# Patient Record
Sex: Male | Born: 1965 | Race: White | Hispanic: No | Marital: Married | Smoking: Never smoker
Health system: Southern US, Community
[De-identification: ages and names within clinical notes are randomized; demographics above are authoritative.]

## PROBLEM LIST (undated history)

## (undated) DIAGNOSIS — G4733 Obstructive sleep apnea (adult) (pediatric): Secondary | ICD-10-CM

## (undated) DIAGNOSIS — I10 Essential (primary) hypertension: Secondary | ICD-10-CM

## (undated) DIAGNOSIS — T7840XA Allergy, unspecified, initial encounter: Secondary | ICD-10-CM

## (undated) DIAGNOSIS — Z9289 Personal history of other medical treatment: Secondary | ICD-10-CM

## (undated) DIAGNOSIS — Z1509 Genetic susceptibility to other malignant neoplasm: Secondary | ICD-10-CM

## (undated) DIAGNOSIS — D229 Melanocytic nevi, unspecified: Secondary | ICD-10-CM

## (undated) HISTORY — DX: Allergy, unspecified, initial encounter: T78.40XA

## (undated) HISTORY — PX: HERNIA REPAIR: SHX51

## (undated) HISTORY — DX: Melanocytic nevi, unspecified: D22.9

## (undated) HISTORY — DX: Genetic susceptibility to other malignant neoplasm: Z15.09

## (undated) HISTORY — DX: Personal history of other medical treatment: Z92.89

## (undated) HISTORY — DX: Essential (primary) hypertension: I10

## (undated) HISTORY — DX: Obstructive sleep apnea (adult) (pediatric): G47.33

---

## 1999-07-15 ENCOUNTER — Emergency Department (HOSPITAL_COMMUNITY): Admission: EM | Admit: 1999-07-15 | Discharge: 1999-07-15 | Payer: Self-pay | Admitting: Emergency Medicine

## 1999-07-15 ENCOUNTER — Encounter: Payer: Self-pay | Admitting: Emergency Medicine

## 1999-07-23 ENCOUNTER — Ambulatory Visit (HOSPITAL_COMMUNITY): Admission: RE | Admit: 1999-07-23 | Discharge: 1999-07-23 | Payer: Self-pay | Admitting: Family Medicine

## 2000-01-11 ENCOUNTER — Encounter: Admission: RE | Admit: 2000-01-11 | Discharge: 2000-01-11 | Payer: Self-pay | Admitting: Family Medicine

## 2000-02-04 ENCOUNTER — Ambulatory Visit (HOSPITAL_COMMUNITY): Admission: RE | Admit: 2000-02-04 | Discharge: 2000-02-04 | Payer: Self-pay | Admitting: Sports Medicine

## 2000-02-04 HISTORY — PX: SKIN BIOPSY: SHX1

## 2000-02-18 ENCOUNTER — Encounter: Admission: RE | Admit: 2000-02-18 | Discharge: 2000-02-18 | Payer: Self-pay | Admitting: Family Medicine

## 2000-02-25 ENCOUNTER — Encounter: Admission: RE | Admit: 2000-02-25 | Discharge: 2000-02-25 | Payer: Self-pay | Admitting: Family Medicine

## 2000-05-09 HISTORY — PX: ANTERIOR CRUCIATE LIGAMENT REPAIR: SHX115

## 2001-03-26 ENCOUNTER — Encounter: Admission: RE | Admit: 2001-03-26 | Discharge: 2001-03-26 | Payer: Self-pay | Admitting: Family Medicine

## 2001-04-30 ENCOUNTER — Encounter: Admission: RE | Admit: 2001-04-30 | Discharge: 2001-04-30 | Payer: Self-pay | Admitting: Family Medicine

## 2001-10-19 ENCOUNTER — Encounter: Admission: RE | Admit: 2001-10-19 | Discharge: 2001-10-19 | Payer: Self-pay | Admitting: Family Medicine

## 2004-03-22 ENCOUNTER — Ambulatory Visit: Payer: Self-pay | Admitting: Internal Medicine

## 2004-04-23 ENCOUNTER — Ambulatory Visit: Payer: Self-pay | Admitting: Internal Medicine

## 2004-05-15 ENCOUNTER — Ambulatory Visit (HOSPITAL_COMMUNITY): Admission: RE | Admit: 2004-05-15 | Discharge: 2004-05-15 | Payer: Self-pay | Admitting: Gastroenterology

## 2004-11-16 ENCOUNTER — Ambulatory Visit: Payer: Self-pay | Admitting: Internal Medicine

## 2004-12-05 ENCOUNTER — Ambulatory Visit: Payer: Self-pay | Admitting: Internal Medicine

## 2004-12-13 ENCOUNTER — Ambulatory Visit: Payer: Self-pay

## 2006-03-06 ENCOUNTER — Ambulatory Visit: Payer: Self-pay | Admitting: Internal Medicine

## 2006-03-06 LAB — CONVERTED CEMR LAB
ALT: 28 units/L (ref 0–40)
AST: 22 units/L (ref 0–37)
Albumin: 3.9 g/dL (ref 3.5–5.2)
Alkaline Phosphatase: 35 units/L — ABNORMAL LOW (ref 39–117)
BUN: 12 mg/dL (ref 6–23)
Basophils Absolute: 0 10*3/uL (ref 0.0–0.1)
Basophils Relative: 0.2 % (ref 0.0–1.0)
Bilirubin Urine: NEGATIVE
CO2: 28 meq/L (ref 19–32)
Calcium: 9.4 mg/dL (ref 8.4–10.5)
Chloride: 105 meq/L (ref 96–112)
Chol/HDL Ratio, serum: 4.7
Cholesterol: 177 mg/dL (ref 0–200)
Creatinine, Ser: 1 mg/dL (ref 0.4–1.5)
Eosinophil percent: 1.7 % (ref 0.0–5.0)
GFR calc non Af Amer: 88 mL/min
Glomerular Filtration Rate, Af Am: 106 mL/min/{1.73_m2}
Glucose, Bld: 99 mg/dL (ref 70–99)
HCT: 45.2 % (ref 39.0–52.0)
HDL: 37.4 mg/dL — ABNORMAL LOW (ref >39.0)
Hemoglobin, Urine: NEGATIVE
Hemoglobin: 16.2 g/dL (ref 13.0–17.0)
Ketones, ur: NEGATIVE mg/dL
LDL Cholesterol: 118 mg/dL — ABNORMAL HIGH (ref 0–99)
Leukocytes, UA: NEGATIVE
Lymphocytes Relative: 27.3 % (ref 12.0–46.0)
MCHC: 35.9 g/dL (ref 30.0–36.0)
MCV: 86.6 fL (ref 78.0–100.0)
Monocytes Absolute: 0.4 10*3/uL (ref 0.2–0.7)
Monocytes Relative: 9.7 % (ref 3.0–11.0)
Neutro Abs: 2.6 10*3/uL (ref 1.4–7.7)
Neutrophils Relative %: 61.1 % (ref 43.0–77.0)
Nitrite: NEGATIVE
PSA: 1.58 ng/mL (ref 0.10–4.00)
Platelets: 203 10*3/uL (ref 150–400)
Potassium: 4.1 meq/L (ref 3.5–5.1)
RBC: 5.22 M/uL (ref 4.22–5.81)
RDW: 11.9 % (ref 11.5–14.6)
Sodium: 139 meq/L (ref 135–145)
Specific Gravity, Urine: >=1.03 (ref 1.000–1.03)
TSH: 0.8 microintl units/mL (ref 0.35–5.50)
Total Bilirubin: 0.9 mg/dL (ref 0.3–1.2)
Total Protein, Urine: NEGATIVE mg/dL
Total Protein: 6.9 g/dL (ref 6.0–8.3)
Triglyceride fasting, serum: 108 mg/dL (ref 0–149)
Urine Glucose: NEGATIVE mg/dL
Urobilinogen, UA: 0.2 (ref 0.0–1.0)
VLDL: 22 mg/dL (ref 0–40)
WBC: 4.3 10*3/uL — ABNORMAL LOW (ref 4.5–10.5)
pH: 5.5 (ref 5.0–8.0)

## 2006-03-10 ENCOUNTER — Ambulatory Visit: Payer: Self-pay | Admitting: Internal Medicine

## 2007-01-23 ENCOUNTER — Encounter: Payer: Self-pay | Admitting: Internal Medicine

## 2007-04-29 ENCOUNTER — Encounter: Payer: Self-pay | Admitting: Internal Medicine

## 2007-04-29 DIAGNOSIS — J309 Allergic rhinitis, unspecified: Secondary | ICD-10-CM | POA: Insufficient documentation

## 2007-04-29 DIAGNOSIS — I10 Essential (primary) hypertension: Secondary | ICD-10-CM | POA: Insufficient documentation

## 2007-06-26 ENCOUNTER — Telehealth: Payer: Self-pay | Admitting: Internal Medicine

## 2007-12-10 ENCOUNTER — Telehealth: Payer: Self-pay | Admitting: Internal Medicine

## 2007-12-11 ENCOUNTER — Ambulatory Visit: Payer: Self-pay | Admitting: Internal Medicine

## 2007-12-11 DIAGNOSIS — E785 Hyperlipidemia, unspecified: Secondary | ICD-10-CM | POA: Insufficient documentation

## 2007-12-11 DIAGNOSIS — R079 Chest pain, unspecified: Secondary | ICD-10-CM | POA: Insufficient documentation

## 2007-12-12 ENCOUNTER — Encounter: Payer: Self-pay | Admitting: Internal Medicine

## 2007-12-16 ENCOUNTER — Telehealth: Payer: Self-pay | Admitting: Internal Medicine

## 2007-12-18 ENCOUNTER — Ambulatory Visit: Payer: Self-pay | Admitting: Psychology

## 2007-12-28 ENCOUNTER — Ambulatory Visit: Payer: Self-pay | Admitting: Psychology

## 2008-01-08 ENCOUNTER — Ambulatory Visit: Payer: Self-pay | Admitting: Psychology

## 2008-01-11 ENCOUNTER — Ambulatory Visit: Payer: Self-pay | Admitting: Psychology

## 2008-01-13 ENCOUNTER — Telehealth: Payer: Self-pay | Admitting: Internal Medicine

## 2008-01-28 ENCOUNTER — Telehealth: Payer: Self-pay | Admitting: Internal Medicine

## 2008-01-28 ENCOUNTER — Ambulatory Visit: Payer: Self-pay | Admitting: Internal Medicine

## 2008-01-28 LAB — CONVERTED CEMR LAB
Cholesterol: 170 mg/dL (ref 0–200)
HDL: 38.2 mg/dL — ABNORMAL LOW (ref >39.0)
LDL Cholesterol: 114 mg/dL — ABNORMAL HIGH (ref 0–99)
Total CHOL/HDL Ratio: 4.5
Triglycerides: 88 mg/dL (ref 0–149)
VLDL: 18 mg/dL (ref 0–40)

## 2008-02-02 ENCOUNTER — Telehealth: Payer: Self-pay | Admitting: Internal Medicine

## 2008-02-10 ENCOUNTER — Telehealth: Payer: Self-pay | Admitting: Internal Medicine

## 2008-02-12 ENCOUNTER — Ambulatory Visit: Payer: Self-pay | Admitting: Psychology

## 2008-03-07 ENCOUNTER — Ambulatory Visit: Payer: Self-pay | Admitting: Psychology

## 2008-03-18 ENCOUNTER — Encounter: Payer: Self-pay | Admitting: Internal Medicine

## 2008-05-23 ENCOUNTER — Ambulatory Visit: Payer: Self-pay | Admitting: Psychology

## 2008-06-14 ENCOUNTER — Encounter: Payer: Self-pay | Admitting: Internal Medicine

## 2008-06-22 ENCOUNTER — Ambulatory Visit: Payer: Self-pay | Admitting: Psychology

## 2008-07-10 ENCOUNTER — Encounter: Payer: Self-pay | Admitting: Internal Medicine

## 2008-07-10 ENCOUNTER — Ambulatory Visit (HOSPITAL_BASED_OUTPATIENT_CLINIC_OR_DEPARTMENT_OTHER): Admission: RE | Admit: 2008-07-10 | Discharge: 2008-07-10 | Payer: Self-pay | Admitting: Otolaryngology

## 2008-07-13 ENCOUNTER — Ambulatory Visit: Payer: Self-pay | Admitting: Internal Medicine

## 2008-08-26 ENCOUNTER — Ambulatory Visit: Payer: Self-pay | Admitting: Psychology

## 2008-09-01 ENCOUNTER — Encounter: Payer: Self-pay | Admitting: Internal Medicine

## 2008-10-05 ENCOUNTER — Ambulatory Visit: Payer: Self-pay | Admitting: Psychology

## 2008-10-17 ENCOUNTER — Ambulatory Visit: Payer: Self-pay | Admitting: Psychology

## 2008-10-31 ENCOUNTER — Telehealth: Payer: Self-pay | Admitting: Internal Medicine

## 2008-12-15 ENCOUNTER — Ambulatory Visit: Payer: Self-pay | Admitting: Psychology

## 2009-02-08 ENCOUNTER — Ambulatory Visit: Payer: Self-pay | Admitting: Psychology

## 2009-02-14 ENCOUNTER — Ambulatory Visit: Payer: Self-pay | Admitting: Psychology

## 2009-03-22 ENCOUNTER — Encounter: Payer: Self-pay | Admitting: Internal Medicine

## 2009-03-29 ENCOUNTER — Encounter: Admission: RE | Admit: 2009-03-29 | Discharge: 2009-03-29 | Payer: Self-pay | Admitting: Otolaryngology

## 2009-05-25 ENCOUNTER — Ambulatory Visit: Payer: Self-pay | Admitting: Psychology

## 2009-07-11 ENCOUNTER — Telehealth: Payer: Self-pay | Admitting: Internal Medicine

## 2009-07-19 ENCOUNTER — Telehealth: Payer: Self-pay | Admitting: Internal Medicine

## 2009-09-01 ENCOUNTER — Encounter: Payer: Self-pay | Admitting: Internal Medicine

## 2009-09-22 ENCOUNTER — Ambulatory Visit: Payer: Self-pay | Admitting: Internal Medicine

## 2009-09-22 DIAGNOSIS — J342 Deviated nasal septum: Secondary | ICD-10-CM | POA: Insufficient documentation

## 2009-09-22 DIAGNOSIS — G4733 Obstructive sleep apnea (adult) (pediatric): Secondary | ICD-10-CM | POA: Insufficient documentation

## 2009-09-22 LAB — CONVERTED CEMR LAB
ALT: 28 units/L (ref 0–53)
AST: 26 units/L (ref 0–37)
Albumin: 4.4 g/dL (ref 3.5–5.2)
Alkaline Phosphatase: 45 units/L (ref 39–117)
BUN: 17 mg/dL (ref 6–23)
Basophils Absolute: 0 10*3/uL (ref 0.0–0.1)
Basophils Relative: 0.6 % (ref 0.0–3.0)
Bilirubin, Direct: 0.2 mg/dL (ref 0.0–0.3)
CO2: 30 meq/L (ref 19–32)
Calcium: 9.5 mg/dL (ref 8.4–10.5)
Chloride: 106 meq/L (ref 96–112)
Cholesterol: 163 mg/dL (ref 0–200)
Creatinine, Ser: 1 mg/dL (ref 0.4–1.5)
Eosinophils Absolute: 0.1 10*3/uL (ref 0.0–0.7)
Eosinophils Relative: 1.3 % (ref 0.0–5.0)
GFR calc non Af Amer: 88.76 mL/min (ref >60)
Glucose, Bld: 87 mg/dL (ref 70–99)
HCT: 44.3 % (ref 39.0–52.0)
HDL: 39.4 mg/dL (ref >39.00)
Hemoglobin: 15.5 g/dL (ref 13.0–17.0)
LDL Cholesterol: 102 mg/dL — ABNORMAL HIGH (ref 0–99)
Lymphocytes Relative: 34.2 % (ref 12.0–46.0)
Lymphs Abs: 1.4 10*3/uL (ref 0.7–4.0)
MCHC: 35.1 g/dL (ref 30.0–36.0)
MCV: 88.6 fL (ref 78.0–100.0)
Monocytes Absolute: 0.5 10*3/uL (ref 0.1–1.0)
Monocytes Relative: 12 % (ref 3.0–12.0)
Neutro Abs: 2.1 10*3/uL (ref 1.4–7.7)
Neutrophils Relative %: 51.9 % (ref 43.0–77.0)
Platelets: 199 10*3/uL (ref 150.0–400.0)
Potassium: 4.2 meq/L (ref 3.5–5.1)
RBC: 5 M/uL (ref 4.22–5.81)
RDW: 12.9 % (ref 11.5–14.6)
Sodium: 139 meq/L (ref 135–145)
Total Bilirubin: 0.7 mg/dL (ref 0.3–1.2)
Total CHOL/HDL Ratio: 4
Total Protein: 7.5 g/dL (ref 6.0–8.3)
Triglycerides: 106 mg/dL (ref 0.0–149.0)
VLDL: 21.2 mg/dL (ref 0.0–40.0)
WBC: 4.1 10*3/uL — ABNORMAL LOW (ref 4.5–10.5)

## 2009-09-27 ENCOUNTER — Telehealth (INDEPENDENT_AMBULATORY_CARE_PROVIDER_SITE_OTHER): Payer: Self-pay | Admitting: *Deleted

## 2010-04-01 ENCOUNTER — Encounter: Payer: Self-pay | Admitting: Internal Medicine

## 2010-04-01 ENCOUNTER — Encounter: Payer: Self-pay | Admitting: Otolaryngology

## 2010-04-08 LAB — CONVERTED CEMR LAB
ALT: 26 units/L (ref 0–53)
AST: 22 units/L (ref 0–37)
Albumin: 4.2 g/dL (ref 3.5–5.2)
Alkaline Phosphatase: 40 units/L (ref 39–117)
BUN: 13 mg/dL (ref 6–23)
Basophils Absolute: 0 10*3/uL (ref 0.0–0.1)
Basophils Relative: 0.4 % (ref 0.0–3.0)
Bilirubin, Direct: 0.2 mg/dL (ref 0.0–0.3)
CO2: 28 meq/L (ref 19–32)
Calcium: 9.2 mg/dL (ref 8.4–10.5)
Chloride: 103 meq/L (ref 96–112)
Cholesterol: 195 mg/dL (ref 0–200)
Creatinine, Ser: 1 mg/dL (ref 0.4–1.5)
Eosinophils Absolute: 0 10*3/uL (ref 0.0–0.7)
Eosinophils Relative: 0.8 % (ref 0.0–5.0)
GFR calc Af Amer: 105 mL/min
GFR calc non Af Amer: 87 mL/min
Glucose, Bld: 98 mg/dL (ref 70–99)
HCT: 46.3 % (ref 39.0–52.0)
HDL: 38 mg/dL — ABNORMAL LOW (ref >39.0)
Hemoglobin: 16.1 g/dL (ref 13.0–17.0)
LDL Cholesterol: 136 mg/dL — ABNORMAL HIGH (ref 0–99)
Lymphocytes Relative: 33.4 % (ref 12.0–46.0)
MCHC: 34.9 g/dL (ref 30.0–36.0)
MCV: 89.8 fL (ref 78.0–100.0)
Monocytes Absolute: 0.4 10*3/uL (ref 0.1–1.0)
Monocytes Relative: 10 % (ref 3.0–12.0)
Neutro Abs: 2.2 10*3/uL (ref 1.4–7.7)
Neutrophils Relative %: 55.4 % (ref 43.0–77.0)
Platelets: 203 10*3/uL (ref 150–400)
Potassium: 3.9 meq/L (ref 3.5–5.1)
RBC: 5.15 M/uL (ref 4.22–5.81)
RDW: 11.6 % (ref 11.5–14.6)
Sodium: 138 meq/L (ref 135–145)
Total Bilirubin: 1 mg/dL (ref 0.3–1.2)
Total CHOL/HDL Ratio: 5.1
Total Protein: 7.4 g/dL (ref 6.0–8.3)
Triglycerides: 103 mg/dL (ref 0–149)
VLDL: 21 mg/dL (ref 0–40)
WBC: 3.9 10*3/uL — ABNORMAL LOW (ref 4.5–10.5)

## 2010-04-10 NOTE — Progress Notes (Signed)
  Phone Note Other Incoming   Request: Send information Summary of Call: Records received from  Dr. Raye Sorrow office. 13 pages forwarded to Dr. Debby Bud for review.

## 2010-04-10 NOTE — Progress Notes (Signed)
Summary: REFILL - HCTZ  Phone Note Refill Request   Refills Requested: Medication #1:  HYDROCHLOROTHIAZIDE 25 MG  TABS Take 1/2  tablet by mouth once a day Last office visit 12/2007. When is pt due for f/u? Ok to refill?   Initial call taken by: Lamar Sprinkles, CMA,  Jul 11, 2009 8:44 AM  Follow-up for Phone Call        OK to refill x 2 months - will need EOV, labs - metabolic 401.9, lipid 272.4, hepatic 995.20 Follow-up by: Jacques Navy MD,  Jul 11, 2009 12:00 PM    Prescriptions: HYDROCHLOROTHIAZIDE 25 MG  TABS (HYDROCHLOROTHIAZIDE) Take 1/2  tablet by mouth once a day  #30 x 0   Entered by:   Lamar Sprinkles, CMA   Authorized by:   Jacques Navy MD   Signed by:   Lamar Sprinkles, CMA on 07/11/2009   Method used:   [facility]  [address] [phone]* (retail[facility]       [address]       [place], [place]  [address]       Ph: [phone]       Fax: [phone]   RxID:   [id number]

## 2010-04-10 NOTE — Progress Notes (Signed)
Summary: [facility]  [facility]   Imported By: Lester [place] 10/03/2009 08:55:30  _____________________________________________________________________  External Attachment:    Type:   Image     Comment:   External Document

## 2010-04-10 NOTE — Assessment & Plan Note (Signed)
Summary: PER FLAG/SD--2 MTH EXTENDED OV--STC   Vital Signs:  Patient profile:   45 year old male Height:      69 inches Weight:      239 pounds BMI:     35.42 O2 Sat:      97 % on Room air Temp:     97.4 degrees F oral Pulse rate:   59 / minute BP sitting:   126 / 88  (left arm) Cuff size:   large  Vitals Entered By: Bill Salinas CMA (September 22, 2009 10:17 AM)  O2 Flow:  Room air CC: CPX/ ab   Primary Care Provider:  Jacques Navy MD  CC:  CPX/ ab.  History of Present Illness: Patient presents for routine medical evaluation. He has had no success with weight loss.   Interval has seen Dr. Lazarus Salines - he had sleep study which was positive and he was tried on CPAP. couldn't tolerate any of the devices. He is also diagnosed with severe right deviated septum and large turbinates. However, the patient doesn't have enough of a problem to feel he wants to proceed with surgery.   Interval - fracture 3rd digit right hand. Saw Dr. Merlyn Lot (son) - did not require surgery. Has some residual stiffness.  Preventive Screening-Counseling & Management  Alcohol-Tobacco     Alcohol drinks/day: 0     Smoking Status: never  Current Medications (verified): 1)  Lisinopril 20 Mg  Tabs (Lisinopril) .... Take 1/2 Tablet By Mouth Once A Day 2)  Aspirin 325 Mg  Tabs (Aspirin) .... Take 1/2 Tablet By Mouth Once A Day 3)  Multivitamins   Tabs (Multiple Vitamin) .... Take 1 Tablet By Mouth Once A Day 4)  Hydrochlorothiazide 25 Mg  Tabs (Hydrochlorothiazide) .... Take 1/2  Tablet By Mouth Once A Day 5)  Ibuprofen 200 Mg  Caps (Ibuprofen) .... As Needed 6)  Lovastatin 40 Mg Tabs (Lovastatin) .... 1/2 Once Daily 7)  Omeprazole 40 Mg Cpdr (Omeprazole) .Marland Kitchen.. 1 To 2 Daily As Needed  Allergies (verified): No Known Drug Allergies  Past History:  Past Medical History: Familial multiple mole and melanoma syndrome Hypertension Allergic rhinitis OSA Deviated septum - right   Physician Roster:   ENT - Dr. Lazarus Salines              Derm - Dr. Jorja Loa              Psych - Dr. Dellia Cloud  Past Surgical History: ACL repair - 05/09/2000,  ETT - 15 minutes - good tolerance, elev. BP - 02/04/2000,  skin lesion bx 11/01 - 02/04/2000 Inguinal herniorrhaphy (right at age 76)  Review of Systems  The patient denies anorexia, weight loss, weight gain, decreased hearing, hoarseness, chest pain, syncope, dyspnea on exertion, peripheral edema, headaches, hematochezia, severe indigestion/heartburn, muscle weakness, suspicious skin lesions, difficulty walking, unusual weight change, enlarged lymph nodes, angioedema, and testicular masses.    Physical Exam  General:  Well-developed,well-nourished,in no acute distress; alert,appropriate and cooperative throughout examination Head:  Normocephalic and atraumatic without obvious abnormalities. No apparent alopecia or balding. Eyes:  No corneal or conjunctival inflammation noted. EOMI. Perrla. Funduscopic exam benign, without hemorrhages, exudates or papilledema. Vision grossly normal. Ears:  External ear exam shows no significant lesions or deformities.  Otoscopic examination reveals clear canals, tympanic membranes are intact bilaterally without bulging, retraction, inflammation or discharge. Hearing is grossly normal bilaterally. Nose:  no external deformity, no external erythema, no mucosal edema, and no airflow obstruction.   Mouth:  Oral mucosa and oropharynx without lesions or exudates.  Teeth in good repair. Neck:  supple, full ROM, no thyromegaly, and no carotid bruits.   Chest Wall:  No deformities, masses, tenderness or gynecomastia noted. Lungs:  Normal respiratory effort, chest expands symmetrically. Lungs are clear to auscultation, no crackles or wheezes. Heart:  Normal rate and regular rhythm. S1 and S2 normal without gallop, murmur, click, rub or other extra sounds. Abdomen:  soft, non-tender, normal bowel sounds, no guarding, no rigidity, and no  hepatomegaly.   Rectal:  no external abnormalities, no hemorrhoids, and normal sphincter tone.   Prostate:  Prostate gland firm and smooth, no enlargement, nodularity, tenderness, mass, asymmetry or induration. Msk:  normal ROM, no joint tenderness, no joint swelling, no redness over joints, and no joint deformities.   Pulses:  2+ radial Extremities:  No clubbing, cyanosis, edema, or deformity noted with normal full range of motion of all joints.   Neurologic:  alert & oriented X3, cranial nerves II-XII intact, strength normal in all extremities, gait normal, and DTRs symmetrical and normal.   Skin:  turgor normal, color normal, no suspicious lesions, and no ulcerations.   Cervical Nodes:  no anterior cervical adenopathy and no posterior cervical adenopathy.   Axillary Nodes:  no R axillary adenopathy and no L axillary adenopathy.   Inguinal Nodes:  no R inguinal adenopathy and no L inguinal adenopathy.   Psych:  Oriented X3, memory intact for recent and remote, normally interactive, and good eye contact.     Impression & Recommendations:  Problem # 1:  SLEEP APNEA, OBSTRUCTIVE (ICD-327.23) Study not available to me.....have requested results. At this time he has no hypersomnolence. He has not tolerated any of the CPAP devices.  Recommendations will be based on review of study.   Problem # 2:  DEVIATED SEPTUM (ICD-470) He denies any airflow obstruction or symptoms.  Plan - watchful waiting.  Problem # 3:  HYPERLIPIDEMIA (ICD-272.4)  His updated medication list for this problem includes:    Lovastatin 40 Mg Tabs (Lovastatin) .Marland Kitchen... 1/2 once daily  Orders: TLB-Lipid Panel (80061-LIPID) TLB-Hepatic/Liver Function Pnl (80076-HEPATIC)  Excellent control on lovastatin. Plan is to continue the same.  Problem # 4:  HYPERTENSION (ICD-401.9)  His updated medication list for this problem includes:    Lisinopril 20 Mg Tabs (Lisinopril) .Marland Kitchen... Take 1/2 tablet by mouth once a day     Hydrochlorothiazide 25 Mg Tabs (Hydrochlorothiazide) .Marland Kitchen... Take 1/2  tablet by mouth once a day  Orders: TLB-BMP (Basic Metabolic Panel-BMET) (80048-METABOL)  BP today: 126/88 Prior BP: 132/88 (12/11/2007)  Good control on present medications.  Problem # 5:  Preventive Health Care (ICD-V70.0) History as noted. Normal physical exam. Lab resuslts are within normal limits. He is current with immunizations.  In summary - a very nice man who appears medically stable at this time. Will review Sleep study when available and get back to him on recommendations.  He will return as needed or in 1 year.   Complete Medication List: 1)  Lisinopril 20 Mg Tabs (Lisinopril) .... Take 1/2 tablet by mouth once a day 2)  Aspirin 325 Mg Tabs (Aspirin) .... Take 1/2 tablet by mouth once a day 3)  Multivitamins Tabs (Multiple vitamin) .... Take 1 tablet by mouth once a day 4)  Hydrochlorothiazide 25 Mg Tabs (Hydrochlorothiazide) .... Take 1/2  tablet by mouth once a day 5)  Ibuprofen 200 Mg Caps (Ibuprofen) .... As needed 6)  Lovastatin 40 Mg Tabs (Lovastatin) .... 1/2  once daily 7)  Omeprazole 40 Mg Cpdr (Omeprazole) .Marland Kitchen.. 1 to 2 daily as needed  Other Orders: TLB-CBC Platelet - w/Differential (85025-CBCD)  Patient: Jordan Travis Note: All result statuses are Final unless otherwise noted.  Tests: (1) Lipid Panel (LIPID)   Cholesterol               163 mg/dL                   3-244     ATP III Classification            Desirable:  < 200 mg/dL                    Borderline High:  200 - 239 mg/dL               High:  > = 240 mg/dL   Triglycerides             106.0 mg/dL                 0.1-027.2     Normal:  <150 mg/dL     Borderline High:  536 - 199 mg/dL   HDL                       64.40 mg/dL                 >34.74   VLDL Cholesterol          21.2 mg/dL                  2.5-95.6   LDL Cholesterol      [H]  387 mg/dL                   5-64  CHO/HDL Ratio:  CHD Risk                             4                     Men          Women     1/2 Average Risk     3.4          3.3     Average Risk          5.0          4.4     2X Average Risk          9.6          7.1     3X Average Risk          15.0          11.0                           Tests: (2) Hepatic/Liver Function Panel (HEPATIC)   Total Bilirubin           0.7 mg/dL                   3.3-2.9   Direct Bilirubin          0.2 mg/dL                   5.1-8.8   Alkaline Phosphatase      45 U/L  39-117   AST                       26 U/L                      0-37   ALT                       28 U/L                      0-53   Total Protein             7.5 g/dL                    1.6-1.0   Albumin                   4.4 g/dL                    9.6-0.4  Tests: (3) BMP (METABOL)   Sodium                    139 mEq/L                   135-145   Potassium                 4.2 mEq/L                   3.5-5.1   Chloride                  106 mEq/L                   96-112   Carbon Dioxide            30 mEq/L                    19-32   Glucose                   87 mg/dL                    54-09   BUN                       17 mg/dL                    8-11   Creatinine                1.0 mg/dL                   9.1-4.7   Calcium                   9.5 mg/dL                   8.2-95.6   GFR                       88.76 mL/min                >60  Tests: (4) CBC Platelet w/Diff (CBCD)   White Cell Count     [L]  4.1 K/uL                    4.5-10.5  Red Cell Count            5.00 Mil/uL                 4.22-5.81   Hemoglobin                15.5 g/dL                   91.4-78.2   Hematocrit                44.3 %                      39.0-52.0   MCV                       88.6 fl                     78.0-100.0   MCHC                      35.1 g/dL                   95.6-21.3   RDW                       12.9 %                      11.5-14.6   Platelet Count            199.0 K/uL                  150.0-400.0   Neutrophil %               51.9 %                      43.0-77.0   Lymphocyte %              34.2 %                      12.0-46.0   Monocyte %                12.0 %                      3.0-12.0   Eosinophils%              1.3 %                       0.0-5.0   Basophils %               0.6 %                       0.0-3.0   Neutrophill Absolute      2.1 K/uL                    1.4-7.7   Lymphocyte Absolute       1.4 K/uL                    0.7-4.0   Monocyte Absolute         0.5 K/uL                    0.1-1.0  Eosinophils,  Absolute                             0.1 K/uL                    0.0-0.7   Basophils Absolute        0.0 K/uL                    0.0-0.1Prescriptions: OMEPRAZOLE 40 MG CPDR (OMEPRAZOLE) 1 to 2 daily as needed  #90 x 3   Entered and Authorized by:   Jacques Navy MD   Signed by:   Jacques Navy MD on 09/22/2009   Method used:   Electronically to        [facility] [phone]* (retail)       [address]       [place], [place]  [address]       Ph: [phone]       Fax: [phone]   RxID:   [id number] LOVASTATIN 40 MG TABS (LOVASTATIN) 1/2 once daily  #90 x 1   Entered and Authorized by:   Jacques Navy MD   Signed by:   Jacques Navy MD on 09/22/2009   Method used:   Electronically to        [facility] [phone]* (retail)       [address]       [place], [place]  [address]       Ph: [phone]       Fax: [phone]   RxID:   [id number]

## 2010-04-10 NOTE — Letter (Signed)
Summary: [facility]  [facility]   Imported By: Lester [place] 10/03/2009 08:57:45  _____________________________________________________________________  External Attachment:    Type:   Image     Comment:   External Document

## 2010-04-10 NOTE — Progress Notes (Signed)
  Phone Note Call from Patient Call back at Work Phone [phone]   Caller: Patient Summary of Call: Per pt, request refill on HCTZ thru [facility] on 07/11/2009 and would like to check status. Per EMR[facility] I have  sent rx to corrected pharmacy and notified pt/ lmovm Initial call taken by: Rock Nephew CMA,  Jul 19, 2009 10:10 AM    Prescriptions: HYDROCHLOROTHIAZIDE 25 MG  TABS (HYDROCHLOROTHIAZIDE) Take 1/2  tablet by mouth once a day  #30 x 2   Entered by:   Rock Nephew CMA   Authorized by:   Jacques Navy MD   Signed by:   Rock Nephew CMA on 07/19/2009   Method used:   Electronically to        [facility] [phone]* (retail)       [address]       [place], [place]  [address]       Ph: [phone]       Fax: [phone]   RxID:   [id number]

## 2010-04-10 NOTE — Letter (Signed)
Summary: [facility]   Imported By: Sherian Rein 03/27/2009 13:33:34  _____________________________________________________________________  External Attachment:    Type:   Image     Comment:   External Document

## 2010-05-31 ENCOUNTER — Ambulatory Visit: Payer: Self-pay | Admitting: Psychology

## 2010-07-24 NOTE — Procedures (Signed)
NAME:  Jordan Travis, CIFELLI NO.:  [id number]   MEDICAL RECORD NO.:  [id number]          PATIENT TYPE:  OUT   LOCATION:  SLEEP CENTER                 FACILITY:  [facility]   PHYSICIAN:  Clinton D. Maple Hudson, MD, FCCP, FACPDATE OF BIRTH:  12-05-64   DATE OF STUDY:  07/10/2008                            NOCTURNAL POLYSOMNOGRAM   REFERRING PHYSICIAN:  Zola Button T. Lazarus Salines, M.D.   INDICATION FOR STUDY:  Hypersomnia with sleep apnea.  Epworth sleepiness  score 11/24, BMI 32, weight 235 pounds, height 72 inches, and neck 18  inches.  Home medications are charted and reviewed.   SLEEP ARCHITECTURE:  Total sleep time 290 minutes with sleep efficiency  71.9%.  Stage I was 19%, stage II 61%, stage III 5.3%, REM 14.7% of  total sleep time.  Sleep latency 48 minutes, REM latency 232 minutes,  awake after sleep onset 67 minutes, arousal index 29.6.  No bedtime  medication was reported.   RESPIRATORY DATA:  Apnea/hypopnea index (AHI) 5.4 per hour.  A total of  26 events was scored all as hypopneas, nonpositional.  REM AHI 5.6 per  hour.  An additional 56 respiratory events related to arousal were  counted as events not meeting duration and oxygen desaturation criteria  to be scored as apneas or hypopneas, but recognized by the technician as  associated with EEG arousal, for a total respiratory disturbance index  of 17 per hour.  There were insufficient early events to permit CPAP  titration by split protocol on the study night.   OXYGEN DATA:  Moderate snoring with oxygen desaturation to a nadir of  85%.  Mean oxygen saturation through the study was 92.5% on room air.   CARDIAC DATA:  Normal sinus rhythm.   MOVEMENT/PARASOMNIA:  No significant movement disturbance.  No bathroom  trips.   IMPRESSION/RECOMMENDATIONS:  1. Minimal obstructive sleep apnea/hypopnea syndrome, AHI 5.4 per      hour.  By the broader measure of respiratory disturbance index      (RDI) of 17 per hour, he would be  scored as mild-to-moderate      obstructive sleep apnea/hypopnea syndrome.  Events were      nonpositional, somewhat more common while supine.  Moderate snoring      with oxygen desaturation to a nadir of 85%.  2. There were insufficient early events to permit continuous positive      airway pressure titration by split protocol on      this study night.  3. On arrival, the patient stated was that sometimes he would wake up      with acid reflux and could not breathe.      Clinton D. Maple Hudson, MD, [facility], FACP  Diplomate, Biomedical engineer of Sleep Medicine  Electronically Signed     CDY/MEDQ  D:  07/15/2008 20:49:10  T:  07/16/2008 06:46:09  Job:  [id number]

## 2010-07-27 NOTE — Assessment & Plan Note (Signed)
[facility]                           PRIMARY CARE OFFICE NOTE   LINKOLN, ALKIRE                     MRN:          [id number]  DATE:03/10/2006                            DOB:          [birth year]    PRIMARY CARE OFFICE NOTE:  Jordan Travis is a 45 year old gentleman who  presents today for routine followup evaluation and exam.  He was last  seen on December 05, 2004.  Please see that dictation.   The patient reports that he has been under some stress lately due to a  social change with his wife who is a Armed forces operational officer having been laid  off after 11 years of work.  At this point the plan is for her to do  some undergraduate at [employer] and then apply to P.A. school.  The patient  reports his brother has had a successful heart transplant, although he  had a difficult course.  His sister had a significant illness after a  lap band procedure with evidently a perforated esophagus with a 56-month  illness involving long term critical care and ventilatory support.  All  of this is combined to cause the patient to have significant stress.  However, he is sleeping well and reports that he is managing and does  not need any intervention or treatment at this time.   PAST MEDICAL HISTORY:   SURGICAL:  1. Repair of right inguinal hernia at age 47.  2. Open surgery of the right knee for repair of an ACL.   MEDICAL:  1. No childhood diseases but fully immunized.  2. Seasonal allergies and hay-fever.  3. Hypertension.   FAMILY HISTORY:  Significant for coronary artery disease with his father  having died at a very young age, having had a first MI at 34.  Mother  had an MI at age 64.  Brother with significant heart disease, now status  post transplant.  Family history also positive for hypertension and CVA.  Family history is negative for colon cancer, prostate cancer, or  diabetes.   SOCIAL HISTORY:  As above.  The patient continues to work full time on  the [employer].  He has been doing off duty security  work as well.  His children are now twin sons age 69 and daughter age 82.   REVIEW OF SYSTEMS:  Negative for any constitutional problems.  He has  had an eye exam in the last 24 months.  He continues to have trouble  with halitosis.  He has had a thorough evaluation for this including  esophagram to rule out Zenker diverticulum.  The patient has had no  cardiovascular, respiratory complaints.  He has occasional sharp  paraesophageal discomfort most likely related to dyspepsia.  GU,  musculoskeletal, dermatologic or neurologic complaints.   PHYSICAL EXAMINATION:  VITAL SIGNS:  Temperature was 99.1, blood  pressure 135/85, pulse 63, weight 240.  GENERAL APPEARANCE:  This is an athletic gentleman, mildly overweight,  in no acute distress.  HEENT:  Normocephalic and atraumatic.  EACs and TMs were normal.  Oropharynx with native dentition in good repair.  No buccal or palate  lesions were noted.  Posterior pharynx was clear.  Conjunctivae and  sclerae were clear.  PERRLA.  EOMI.  Funduscopic exam was unremarkable.  NECK:  Supple without thyromegaly.  NODES:  No adenopathy was noted in the cervical, supraclavicular  regions.  CHEST:  No CVA tenderness.  LUNGS:  Clear to auscultation and percussion.  CARDIOVASCULAR:  With 2+ radial pulses, JVD or carotid bruits.  He had a  quiet precordium with a regular rate and rhythm without murmurs, rubs,  or gallops.  ABDOMEN:  Soft.  No guarding or rebound.  No organomegaly or  splenomegaly noted.  GENITALIA:  Normal male phallus.  Bilaterally descended testicles  without masses.  RECTAL:  Revealed normal sphincter tone.  No masses in the rectal vault.  Prostate was smooth, normal in size and contour without nodule.  The  patient had no evidence of inguinal hernia on examination.  EXTREMITIES:  Without clubbing, cyanosis, or edema.  No deformity was  noted.  NEUROLOGIC:   Nonfocal.  SKIN:  Clear.   LABORATORY:  Hemoglobin 16.2 grams, white count was 4,300 with a normal  differential.  Cholesterol is 177, triglycerides 108, HDL was 37.4, LDL  was 118.  Chemistries were normal with a blood sugar of 99.  Electrolytes were normal.  Kidney function normal with a creatinine of 1  and a GFR of 88 ml/min.  Thyroid function normal with a TSH of 0.80.  PSA was normal at 1.58.  Urinalysis was negative.   ASSESSMENT/PLAN:  1. Hypertension.  The patient's blood pressure is adequately      controlled on his present medical regimen and he will continue the      same which includes Lisinopril 20 mg daily, HCTZ 12.5 mg daily.  2. Cardiovascular risk management.  The patient's LDL cholesterol is      at goal for a gentleman his age with his risk profile.  However, he      has a very strong positive family history.  I did discuss with him      the reasonable option of starting Statin medication in order to      drive his LDL down into the sub-100 range and actually into the 70      to 80 range for maximum risk reduction.  I have directed the      patient to medlineplus.gov for more information about cholesterol      management and Statin drugs.  We did price out medications looking      at generic Simvastatin, Lovastatin, and Pravastatin all of which      would put him in the $30/month range cash pay and these are all      second tier drugs.  The patient will get back with me in regards to      his wishes in this regard.   In summary, the patient is medically stable at this time with a normal  examination as noted.  Blood pressure is well controlled with no other  abnormalities found on exam.  He will be back in touch with me in  regards to his wishes and in regards to risk reduction.     Rosalyn Gess Norins, MD  Electronically Signed    MEN/MedQ  DD: 03/11/2006  DT: 03/11/2006  Job #: [id number]   cc:   Berneda Rose, Mr.

## 2010-08-27 ENCOUNTER — Other Ambulatory Visit: Payer: Self-pay | Admitting: *Deleted

## 2010-08-28 ENCOUNTER — Other Ambulatory Visit: Payer: Self-pay | Admitting: *Deleted

## 2010-08-28 MED ORDER — HYDROCHLOROTHIAZIDE 25 MG PO TABS: 25.0000 mg | ORAL_TABLET | Freq: Every day | ORAL | Status: DC

## 2010-09-23 ENCOUNTER — Other Ambulatory Visit: Payer: Self-pay | Admitting: Internal Medicine

## 2010-10-02 ENCOUNTER — Other Ambulatory Visit: Payer: Self-pay | Admitting: Internal Medicine

## 2010-11-19 ENCOUNTER — Telehealth: Payer: Self-pay | Admitting: *Deleted

## 2010-11-19 DIAGNOSIS — Z0000 Encounter for general adult medical examination without abnormal findings: Secondary | ICD-10-CM

## 2010-11-19 NOTE — Telephone Encounter (Signed)
CPX LABS 

## 2010-12-14 ENCOUNTER — Encounter: Payer: Self-pay | Admitting: Internal Medicine

## 2011-01-22 ENCOUNTER — Encounter: Payer: Self-pay | Admitting: Internal Medicine

## 2011-01-29 ENCOUNTER — Encounter: Payer: Self-pay | Admitting: Internal Medicine

## 2011-04-10 ENCOUNTER — Other Ambulatory Visit (INDEPENDENT_AMBULATORY_CARE_PROVIDER_SITE_OTHER): Payer: 59

## 2011-04-10 ENCOUNTER — Ambulatory Visit (INDEPENDENT_AMBULATORY_CARE_PROVIDER_SITE_OTHER): Payer: 59 | Admitting: Internal Medicine

## 2011-04-10 ENCOUNTER — Encounter: Payer: Self-pay | Admitting: Internal Medicine

## 2011-04-10 DIAGNOSIS — Z0000 Encounter for general adult medical examination without abnormal findings: Secondary | ICD-10-CM

## 2011-04-10 DIAGNOSIS — E785 Hyperlipidemia, unspecified: Secondary | ICD-10-CM

## 2011-04-10 DIAGNOSIS — F419 Anxiety disorder, unspecified: Secondary | ICD-10-CM

## 2011-04-10 DIAGNOSIS — F411 Generalized anxiety disorder: Secondary | ICD-10-CM

## 2011-04-10 DIAGNOSIS — I10 Essential (primary) hypertension: Secondary | ICD-10-CM

## 2011-04-10 DIAGNOSIS — G4733 Obstructive sleep apnea (adult) (pediatric): Secondary | ICD-10-CM

## 2011-04-10 LAB — LIPID PANEL
HDL: 40.3 mg/dL (ref >39.00)
LDL Cholesterol: 96 mg/dL (ref 0–99)
Total CHOL/HDL Ratio: 4
VLDL: 33.6 mg/dL (ref 0.0–40.0)

## 2011-04-10 LAB — COMPREHENSIVE METABOLIC PANEL
ALT: 35 U/L (ref 0–53)
AST: 28 U/L (ref 0–37)
Albumin: 4.2 g/dL (ref 3.5–5.2)
Alkaline Phosphatase: 45 U/L (ref 39–117)
Calcium: 9.3 mg/dL (ref 8.4–10.5)
Chloride: 104 mEq/L (ref 96–112)
Potassium: 3.8 mEq/L (ref 3.5–5.1)
Sodium: 140 mEq/L (ref 135–145)
Total Protein: 7.4 g/dL (ref 6.0–8.3)

## 2011-04-10 LAB — HEPATIC FUNCTION PANEL
ALT: 35 U/L (ref 0–53)
Bilirubin, Direct: 0 mg/dL (ref 0.0–0.3)
Total Bilirubin: 0.4 mg/dL (ref 0.3–1.2)

## 2011-04-10 MED ORDER — ALPRAZOLAM 0.25 MG PO TABS: ORAL_TABLET | ORAL | Status: DC

## 2011-04-14 DIAGNOSIS — Z0000 Encounter for general adult medical examination without abnormal findings: Secondary | ICD-10-CM | POA: Insufficient documentation

## 2011-04-14 DIAGNOSIS — F419 Anxiety disorder, unspecified: Secondary | ICD-10-CM | POA: Insufficient documentation

## 2011-04-14 NOTE — Assessment & Plan Note (Signed)
Reviewed reason for "statin" therapy - he has a very positive family history of CAD and although his cholesterol levels were in normal range for additional risk reduction in light of his family hx "statins" were started. He has good numbers on lipid panel.  Plan - continue cardiac risk reduction.

## 2011-04-14 NOTE — Assessment & Plan Note (Signed)
Occasional irritability due to anxiety. Does carry this home with him.  Plan - xanax 0.25 mg prn           He is advised to inform medical officer at [facility] - xanax will show up on a drug screen as "benzo" use.

## 2011-04-14 NOTE — Assessment & Plan Note (Signed)
Unable to tolerate CPAP mask due to deviated septum. He denies any significant problem with daytime somnolence or any other symptoms related to sleep apnea.

## 2011-04-14 NOTE — Assessment & Plan Note (Signed)
BP Readings from Last 3 Encounters:  04/10/11 118/76  09/22/09 126/88  12/11/07 132/88   Very good control

## 2011-04-14 NOTE — Progress Notes (Signed)
Subjective:    Patient ID: Jordan Travis, male    DOB: [birth year], 46 y.o.   MRN: [id number]  HPI .Mr. Patriarca presents for general medical follow up. He has been doing well since his last visit. He has reduced stress at work. He is working out on a regular basis. He has had no major illness, injury or surgery. He has 3 teenagers and this is a source of some stress. He does at times have increased anxiety and irritability.  Past Medical History  Diagnosis Date  . Hypertension   . Allergy   . OSA (obstructive sleep apnea)   . 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 History of ETT     good tolerance, elevated BP 02-04-2000   Past Surgical History  Procedure Date  . Hernia repair At age 65    Inguinal  . Anterior cruciate ligament repair 03.01.2002  . Skin biopsy 11.26.2001   Family History  Problem Relation Age of Onset  . Heart disease Mother     CAD/MI  . Heart disease Father     CAD/MI, endocarditis  . Heart disease Brother     CAD/MI; cardiomyopathy - s/p heart transplant and doing well  . Diabetes Neg Hx   . Cancer Brother 41    Prostate   History   Social History  . Marital Status: Married    Spouse Name: N/A    Number of Children: N/A  . Years of Education: N/A   Occupational History  . Not on file.   Social History Main Topics  . Smoking status: Never Smoker   . Smokeless tobacco: Not on file  . Alcohol Use: Not on file  . Drug Use: Not on file  . Sexually Active: Not on file   Other Topics Concern  . Not on file   Social History Narrative   Sadie Haber, married 1993, twin sons 1999 1 daughter 91. 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        Review of Systems Constitutional:  Negative for fever, chills, activity change and unexpected weight change.  HEENT:  Negative for hearing loss, ear pain, congestion, neck stiffness and postnasal drip. Negative for sore throat or swallowing problems. Negative  for dental complaints.   Eyes: Negative for vision loss or change in visual acuity.  Respiratory: Negative for chest tightness and wheezing. Negative for DOE.   Cardiovascular: Negative for chest pain or palpitations. No decreased exercise tolerance Gastrointestinal: No change in bowel habit. No bloating or gas. No reflux or indigestion Genitourinary: Negative for urgency, frequency, flank pain and difficulty urinating.  Musculoskeletal: Negative for myalgias, back pain, arthralgias and gait problem.  Neurological: Negative for dizziness, tremors, weakness and headaches.  Hematological: Negative for adenopathy.  Psychiatric/Behavioral: Negative for behavioral problems and dysphoric mood.       Objective:   Physical Exam Filed Vitals:   04/10/11 1312  BP: 118/76  Pulse: 72  Temp: 97.5 F (36.4 C)  Resp: 16   Gen'l: Well nourished well developed white male in no acute distress  HEENT: Head: Normocephalic and atraumatic. Right Ear: External ear normal. EAC/TM nl. Left Ear: External ear normal.  EAC/TM nl. Nose: Nose normal. Mouth/Throat: Oropharynx is clear and moist. Dentition - native, in good repair. No buccal or palatal lesions. Posterior pharynx clear. Eyes: Conjunctivae and sclera clear. EOM intact. Pupils are equal, round, and reactive to light. Right eye exhibits no discharge. Left eye exhibits no discharge. Neck:  Normal range of motion. Neck supple. No JVD present. No tracheal deviation present. No thyromegaly present.  Cardiovascular: Normal rate, regular rhythm, no gallop, no friction rub, no murmur heard.      Quiet precordium. 2+ radial and DP pulses . No carotid bruits Pulmonary/Chest: Effort normal. No respiratory distress or increased WOB, no wheezes, no rales. No chest wall deformity or CVAT. Abdominal: Soft. Bowel sounds are normal in all quadrants. He exhibits no distension, no tenderness, no rebound or guarding, No heptosplenomegaly  Genitourinary:   deferred Musculoskeletal: Normal range of motion. He exhibits no edema and no tenderness.       Small and large joints without redness, synovial thickening or deformity. Full range of motion preserved about all small, median and large joints.  Lymphadenopathy:    He has no cervical or supraclavicular adenopathy.  Neurological: He is alert and oriented to person, place, and time. CN II-XII intact. DTRs 2+ and symmetrical biceps, radial and patellar tendons. Cerebellar function normal with no tremor, rigidity, normal gait and station.  Skin: Skin is warm and dry. No rash noted. No erythema.  Psychiatric: He has a normal mood and affect. His behavior is normal. Thought content normal.   Lab Results  Component Value Date   WBC 4.1* 09/22/2009   HGB 15.5 09/22/2009   HCT 44.3 09/22/2009   PLT 199.0 09/22/2009   GLUCOSE 96 04/10/2011   CHOL 170 04/10/2011   TRIG 168.0* 04/10/2011   HDL 40.30 04/10/2011   LDLCALC 96 04/10/2011   ALT 35 04/10/2011   ALT 35 04/10/2011   AST 28 04/10/2011   AST 28 04/10/2011   NA 140 04/10/2011   K 3.8 04/10/2011   CL 104 04/10/2011   CREATININE 1.1 04/10/2011   BUN 17 04/10/2011   CO2 28 04/10/2011   TSH 0.80 03/06/2006   PSA 1.58 03/06/2006           Assessment & Plan:

## 2011-04-14 NOTE — Assessment & Plan Note (Signed)
Interval medical history is unremarkable. Physical exam is normal. Lab results are in normal range. He is due for Tdap. He does have annual work related exams and has been normal.   In summary - a very nice man who is medically stable. He will return as needed or in 1 year.

## 2011-04-23 ENCOUNTER — Other Ambulatory Visit: Payer: Self-pay | Admitting: *Deleted

## 2011-04-23 MED ORDER — LOVASTATIN 20 MG PO TABS: 20.0000 mg | ORAL_TABLET | Freq: Every day | ORAL | Status: DC

## 2011-04-23 NOTE — Telephone Encounter (Signed)
Done

## 2011-05-28 IMAGING — RF DG ESOPHAGUS
9 series · 19 of 23 positions shown · non-contrast
Comparison: 05/15/2004

CLINICAL DATA: Evaluate reflux.  Question diverticulum.

ESOPHOGRAM/BARIUM SWALLOW
TECHNIQUE: Combined double contrast and single contrast
examination performed using effervescent crystals, thick barium
liquid, and thin barium liquid.
Fluoroscopy time:  3.1 minutes.

[Series 1: run · 2 of 3 slices shown (1 of 9)]
[im 1/3]
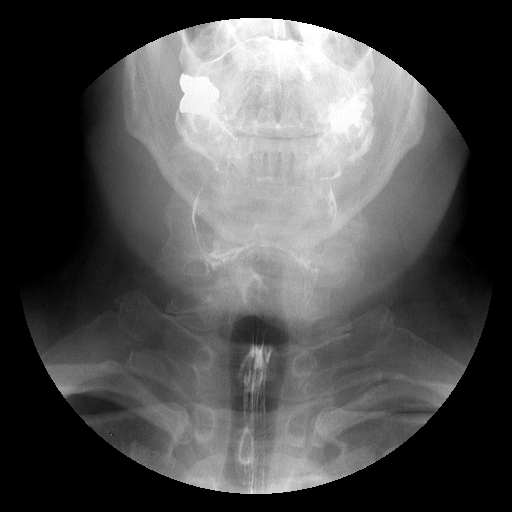
[im 2/3]
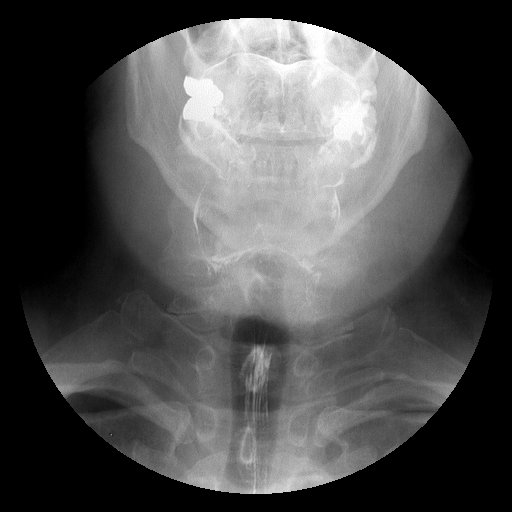

[Series 2: run · 6 of 7 slices shown (2 of 9)]
[im 1/7]
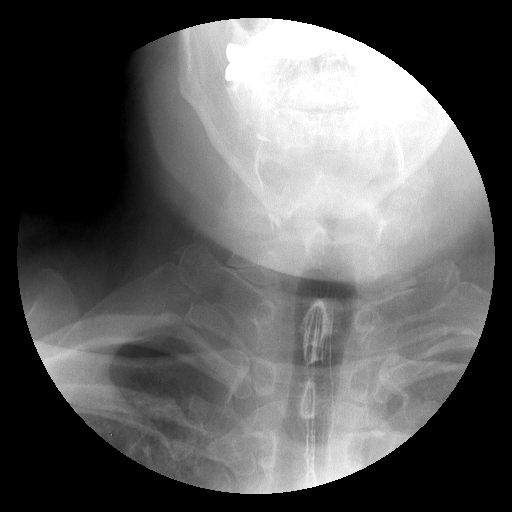
[im 2/7]
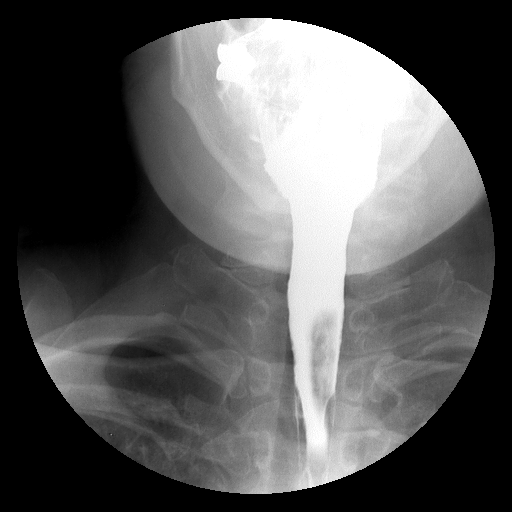
[im 3/7]
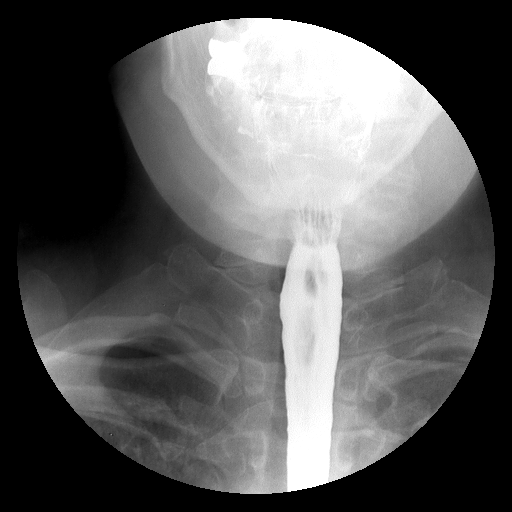
[im 4/7]
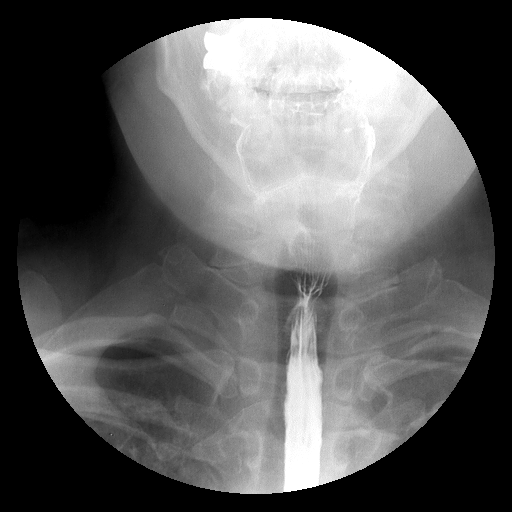
[im 5/7]
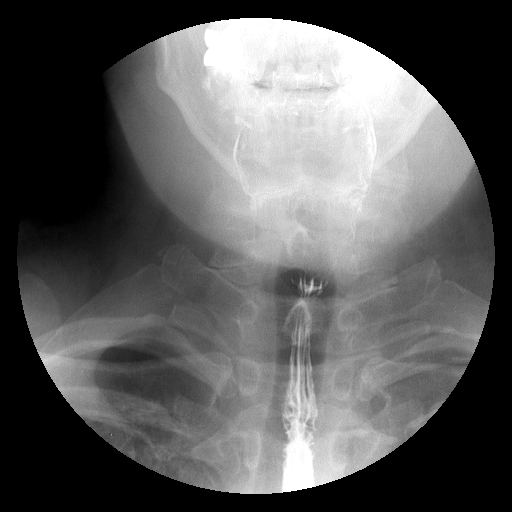
[im 7/7]
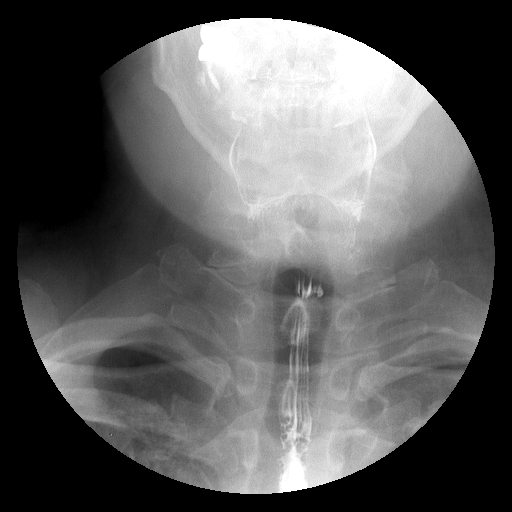

[Series 3: run · 2 of 2 slices shown (3 of 9)]
[im 1/2]
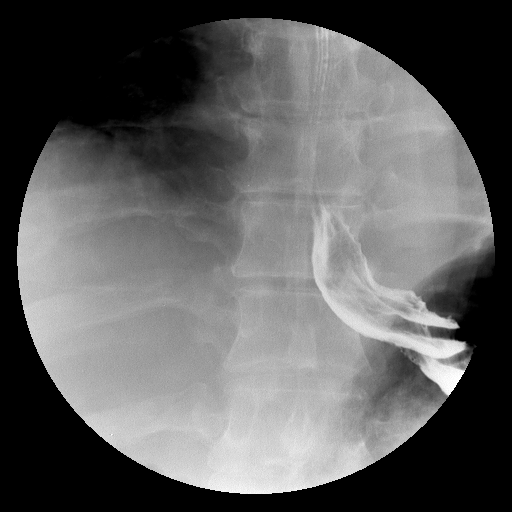
[im 2/2]
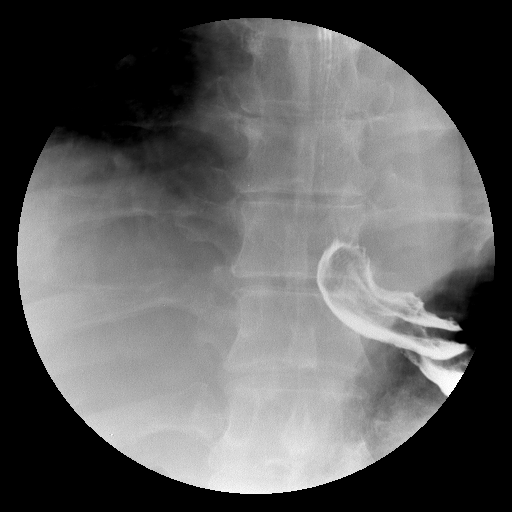

[Series 4: run · 2 of 2 slices shown (4 of 9)]
[im 1/2]
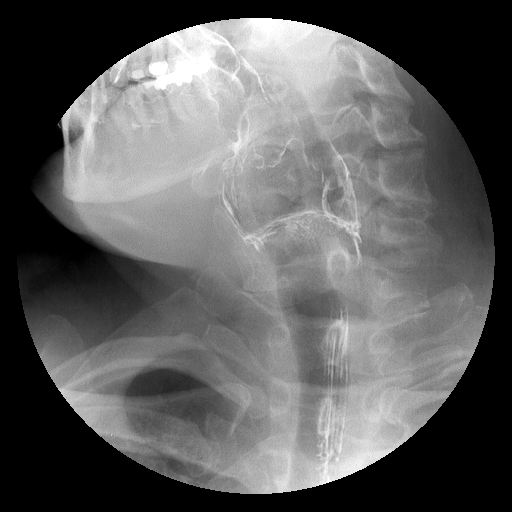
[im 2/2]
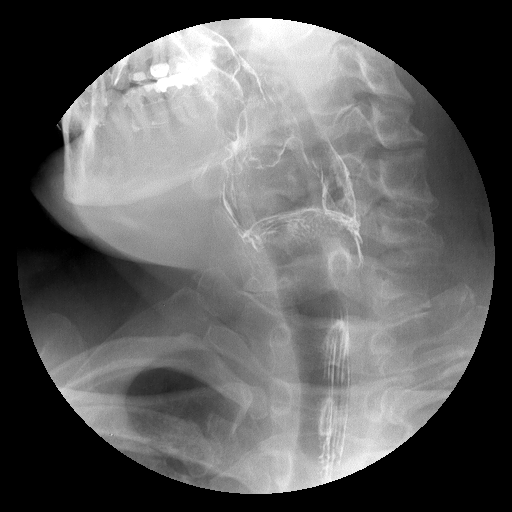

[Series 5: run · 1 of 2 slices shown (5 of 9)]
[im 2/2]
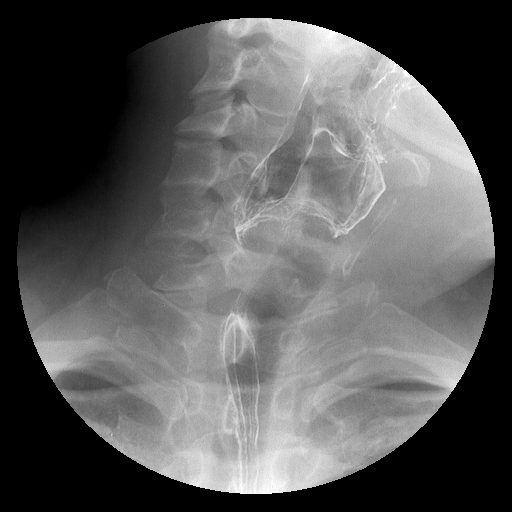

[Series 6: run · 2 of 2 slices shown (6 of 9)]
[im 1/2]
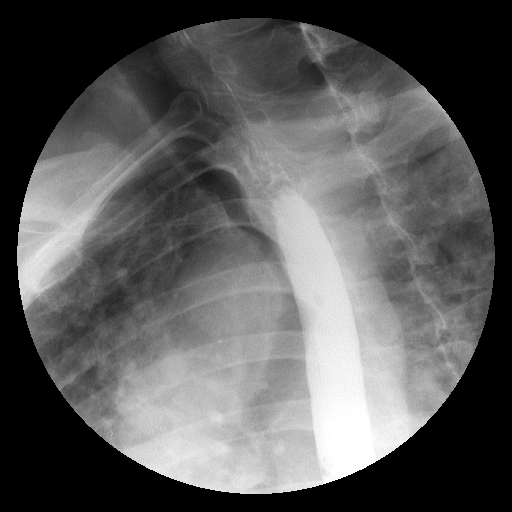
[im 2/2]
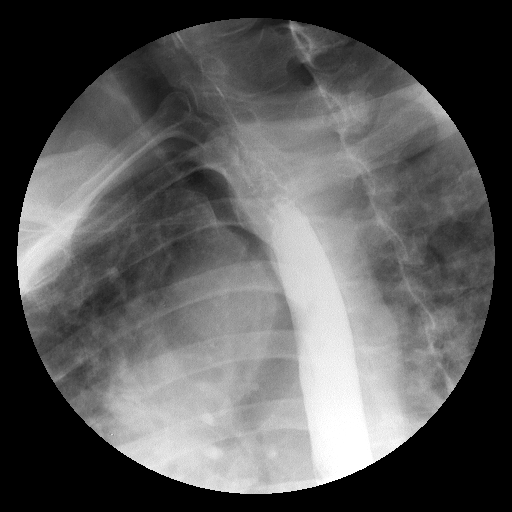

[Series 7: run · 2 of 2 slices shown (7 of 9)]
[im 1/2]
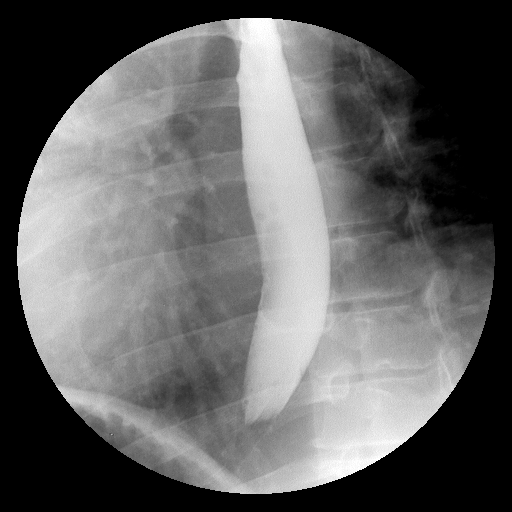
[im 2/2]
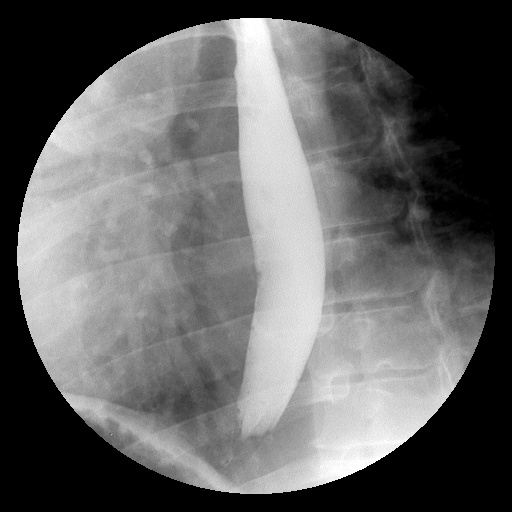

[Series 8: run · 1 of 2 slices shown (8 of 9)]
[im 2/2]
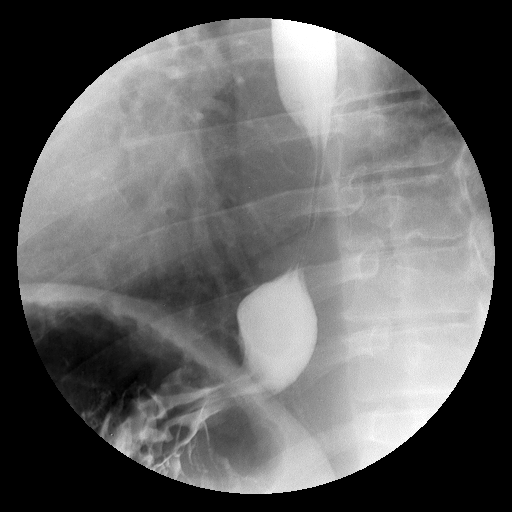

[Series 9: run · 1 of 1 slices shown (9 of 9)]
[im 1/1]
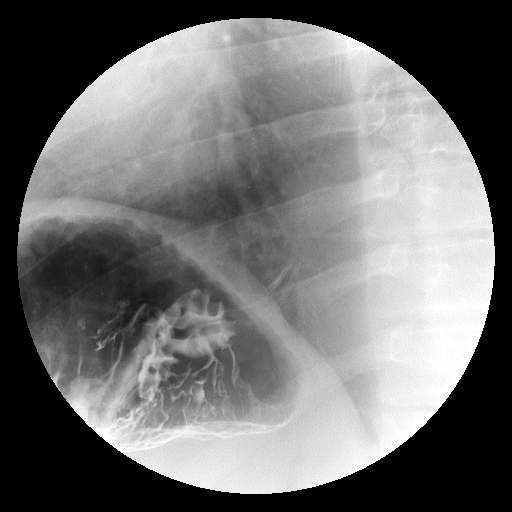

[19 of 23 positions shown; findings below may reference images not displayed]

FINDINGS: Normal swallowing function.  Valleculae and piriform
sinuses are symmetrical.  No Hermann Peter diverticulum.  No stricture.
The patient swallowed a 13 mm barium tablet in the erect position.
Tablet traversed the esophagus and entered the stomach without
delay.  Normal esophageal motility.  No hiatal hernia.  Moderate GE
reflux was noted.
IMPRESSION: GE reflux, otherwise negative study.

## 2011-08-06 ENCOUNTER — Other Ambulatory Visit: Payer: Self-pay | Admitting: Internal Medicine

## 2011-08-08 ENCOUNTER — Ambulatory Visit (INDEPENDENT_AMBULATORY_CARE_PROVIDER_SITE_OTHER): Payer: 59 | Admitting: Psychology

## 2011-08-08 DIAGNOSIS — F4321 Adjustment disorder with depressed mood: Secondary | ICD-10-CM

## 2011-08-16 ENCOUNTER — Other Ambulatory Visit: Payer: Self-pay | Admitting: *Deleted

## 2011-08-16 MED ORDER — LOVASTATIN 20 MG PO TABS: 20.0000 mg | ORAL_TABLET | Freq: Every day | ORAL | Status: DC

## 2011-08-16 NOTE — Telephone Encounter (Signed)
Pharmacy reqeust to change lovastatin 20mg  to 90 day supply and 3 refills

## 2011-09-10 ENCOUNTER — Other Ambulatory Visit: Payer: Self-pay | Admitting: *Deleted

## 2011-09-10 MED ORDER — LOVASTATIN 20 MG PO TABS: 20.0000 mg | ORAL_TABLET | Freq: Every day | ORAL | Status: DC

## 2011-11-12 ENCOUNTER — Other Ambulatory Visit: Payer: Self-pay | Admitting: Internal Medicine

## 2012-02-10 ENCOUNTER — Other Ambulatory Visit: Payer: Self-pay | Admitting: Internal Medicine

## 2012-03-25 ENCOUNTER — Other Ambulatory Visit: Payer: Self-pay | Admitting: Dermatology

## 2012-04-02 ENCOUNTER — Ambulatory Visit (INDEPENDENT_AMBULATORY_CARE_PROVIDER_SITE_OTHER): Payer: 59 | Admitting: Psychology

## 2012-04-02 DIAGNOSIS — F4321 Adjustment disorder with depressed mood: Secondary | ICD-10-CM

## 2012-04-13 ENCOUNTER — Ambulatory Visit (INDEPENDENT_AMBULATORY_CARE_PROVIDER_SITE_OTHER): Payer: 59 | Admitting: Internal Medicine

## 2012-04-13 ENCOUNTER — Other Ambulatory Visit (INDEPENDENT_AMBULATORY_CARE_PROVIDER_SITE_OTHER): Payer: 59

## 2012-04-13 ENCOUNTER — Encounter: Payer: Self-pay | Admitting: Internal Medicine

## 2012-04-13 VITALS — BP 136/88 | HR 63 | Temp 97.6°F | Resp 10 | Ht 70.5 in | Wt 246.1 lb

## 2012-04-13 DIAGNOSIS — G4733 Obstructive sleep apnea (adult) (pediatric): Secondary | ICD-10-CM

## 2012-04-13 DIAGNOSIS — Z0000 Encounter for general adult medical examination without abnormal findings: Secondary | ICD-10-CM

## 2012-04-13 DIAGNOSIS — E785 Hyperlipidemia, unspecified: Secondary | ICD-10-CM

## 2012-04-13 DIAGNOSIS — Z125 Encounter for screening for malignant neoplasm of prostate: Secondary | ICD-10-CM

## 2012-04-13 DIAGNOSIS — Z23 Encounter for immunization: Secondary | ICD-10-CM

## 2012-04-13 DIAGNOSIS — N529 Male erectile dysfunction, unspecified: Secondary | ICD-10-CM

## 2012-04-13 DIAGNOSIS — I10 Essential (primary) hypertension: Secondary | ICD-10-CM

## 2012-04-13 DIAGNOSIS — B070 Plantar wart: Secondary | ICD-10-CM

## 2012-04-13 DIAGNOSIS — F419 Anxiety disorder, unspecified: Secondary | ICD-10-CM

## 2012-04-13 DIAGNOSIS — F411 Generalized anxiety disorder: Secondary | ICD-10-CM

## 2012-04-13 LAB — COMPREHENSIVE METABOLIC PANEL
ALT: 33 U/L (ref 0–53)
AST: 30 U/L (ref 0–37)
Albumin: 4.1 g/dL (ref 3.5–5.2)
Calcium: 9.1 mg/dL (ref 8.4–10.5)
Chloride: 104 mEq/L (ref 96–112)
Potassium: 4.2 mEq/L (ref 3.5–5.1)

## 2012-04-13 LAB — PSA: PSA: 2.27 ng/mL (ref 0.10–4.00)

## 2012-04-13 LAB — HEPATIC FUNCTION PANEL
Albumin: 4.1 g/dL (ref 3.5–5.2)
Total Protein: 7 g/dL (ref 6.0–8.3)

## 2012-04-13 LAB — LIPID PANEL
Cholesterol: 164 mg/dL (ref 0–200)
LDL Cholesterol: 109 mg/dL — ABNORMAL HIGH (ref 0–99)
Total CHOL/HDL Ratio: 4

## 2012-04-13 MED ORDER — ALPRAZOLAM 0.25 MG PO TABS: ORAL_TABLET | ORAL | Status: DC

## 2012-04-13 MED ORDER — LISINOPRIL 20 MG PO TABS: 20.0000 mg | ORAL_TABLET | Freq: Every day | ORAL | Status: DC

## 2012-04-13 MED ORDER — ALPRAZOLAM 0.5 MG PO TABS: ORAL_TABLET | ORAL | Status: DC

## 2012-04-13 MED ORDER — OMEPRAZOLE 40 MG PO CPDR: 40.0000 mg | DELAYED_RELEASE_CAPSULE | Freq: Every day | ORAL | Status: DC

## 2012-04-13 MED ORDER — HYDROCHLOROTHIAZIDE 25 MG PO TABS: 25.0000 mg | ORAL_TABLET | Freq: Every day | ORAL | Status: DC

## 2012-04-13 NOTE — Patient Instructions (Addendum)
Doing well overall.  Getting up at night is minor enlargement of the prostate.  ED - may be there is not enough time for romance. Doubt any underlying disease. Can try viagra  Plantar's wart - use a pumice stone and shol's circular pad. If not helpful return for cryotherapy  Full lab today - result in the note I will send and will be available on MyChart

## 2012-04-13 NOTE — Progress Notes (Signed)
Subjective:    Patient ID: Jordan Travis, male    DOB: January 25, 1966, 47 y.o.   MRN: [id number]  HPI The patient is here for annual  wellness examination and management of other chronic and acute problems.   Exercising 3-5 times a week. Has run a half mararthon, did  A mini-triathon.  Has had nocturia x 1-2. Mild BPH. Also has ED.   Mild amount of blood on the tissue with BM. NO frank bleeding.  Has concerns about prostate cancer - brother was diagnosed in his early 41's.  Has a place on his foot (he does see) - feels like a small stone at the base of the 3rd toe right    The risk factors are reflected in the social history.  The roster of all physicians providing medical care to patient - is listed in the Snapshot section of the chart.  Activities of daily living:  The patient is 100% inedpendent in all ADLs: dressing, toileting, feeding as well as independent mobility  Home safety : The patient has smoke detectors in the home. Falls -home is fall safe. They wear seatbelts. firearms are present in the home, kept in a safe fashion. There is no violence in the home.   There is no risks for hepatitis, STDs or HIV. There is no history of blood transfusion. They have no travel history to infectious disease endemic areas of the world.  The patient has seen their dentist in the last six month. They have seen their eye doctor in the last year. They deny any hearing difficulty and have not had audiologic testing in the last year.    They do not  have excessive sun exposure. Discussed the need for sun protection: hats, long sleeves and use of sunscreen if there is significant sun exposure.   Diet: the importance of a healthy diet is discussed. They do have a unhealthy-high fat/fast food diet.  The patient has a regular exercise program: works out / runs , 45 min duration, 3-5per week.  The benefits of regular aerobic exercise were discussed.  Depression screen: there are no signs or  vegative symptoms of depression- irritability, change in appetite, anhedonia, sadness/tearfullness.  Cognitive assessment: the patient manages all their financial and personal affairs and is actively engaged.   The following portions of the patient's history were reviewed and updated as appropriate: allergies, current medications, past family history, past medical history,  past surgical history, past social history  and problem list.  Vision, hearing, body mass index were assessed and reviewed.   Past Medical History  Diagnosis Date  . Hypertension   . Allergy   . OSA (obstructive sleep apnea)   . 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 History of ETT     good tolerance, elevated BP 02-04-2000   Past Surgical History  Procedure Date  . Hernia repair At age 58    Inguinal  . Anterior cruciate ligament repair 03.01.2002  . Skin biopsy 11.26.2001   Family History  Problem Relation Age of Onset  . Heart disease Mother     CAD/MI  . Heart disease Father     CAD/MI, endocarditis  . Heart disease Brother     CAD/MI; cardiomyopathy - s/p heart transplant and doing well  . Diabetes Neg Hx   . Cancer Brother 37    Prostate   History   Social History  . Marital Status: Married    Spouse Name: N/A    Number  of Children: N/A  . Years of Education: N/A   Occupational History  . Not on file.   Social History Main Topics  . Smoking status: Never Smoker   . Smokeless tobacco: Not on file  . Alcohol Use: Not on file  . Drug Use: Not on file  . Sexually Active: Not on file   Other Topics Concern  . Not on file   Social History Narrative   Sadie Haber, married 1993, twin sons 1999 1 daughter 75. 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     Current Outpatient Prescriptions on File Prior to Visit  Medication Sig Dispense Refill  . aspirin 325 MG tablet Take 162.5 mg by mouth daily.        . hydrochlorothiazide (HYDRODIURIL) 25 MG  tablet Take 1 tablet (25 mg total) by mouth daily.  90 tablet  3  . lisinopril (PRINIVIL,ZESTRIL) 20 MG tablet Take 1 tablet (20 mg total) by mouth daily.  90 tablet  3  . lovastatin (MEVACOR) 20 MG tablet Take 1 tablet (20 mg total) by mouth at bedtime.  90 tablet  3  . Multiple Vitamin (MULTIVITAMIN) tablet Take 1 tablet by mouth daily.        Marland Kitchen omeprazole (PRILOSEC) 40 MG capsule Take 1 capsule (40 mg total) by mouth daily.  90 capsule  3     During the course of the visit the patient was educated and counseled about appropriate screening and preventive services including : fall prevention , diabetes screening, nutrition counseling, colorectal cancer screening, and recommended immunizations.    Review of Systems Constitutional:  Negative for fever, chills, activity change and unexpected weight change.  HEENT:  Negative for hearing loss, ear pain, congestion, neck stiffness and postnasal drip. Negative for sore throat or swallowing problems. Negative for dental complaints.   Eyes: Negative for vision loss or change in visual acuity.  Respiratory: Negative for chest tightness and wheezing. Negative for DOE.   Cardiovascular: Negative for chest pain or palpitations. No decreased exercise tolerance Gastrointestinal: No change in bowel habit. No bloating or gas. No reflux or indigestion Genitourinary: Negative for urgency, frequency, flank pain and difficulty urinating. Nocturia x 1-2. ED Musculoskeletal: Negative for myalgias, back pain, arthralgias and gait problem.  Neurological: Negative for dizziness, tremors, weakness and headaches.  Hematological: Negative for adenopathy.  Psychiatric/Behavioral: Negative for behavioral problems and dysphoric mood.       Objective:   Physical Exam Filed Vitals:   04/13/12 0908  BP: 136/88  Pulse: 63  Temp: 97.6 F (36.4 C)  Resp: 10   Wt Readings from Last 3 Encounters:  04/13/12 246 lb 1.9 oz (111.639 kg)  04/10/11 247 lb (112.038 kg)   09/22/09 239 lb (108.41 kg)   Gen'l: Well nourished well developed white male in no acute distress  HEENT: Head: Normocephalic and atraumatic. Right Ear: External ear normal. EAC/TM nl. Left Ear: External ear normal.  EAC/TM nl. Nose: Nose normal. Mouth/Throat: Oropharynx is clear and moist. Dentition - native, in good repair. No buccal or palatal lesions. Posterior pharynx clear. Eyes: Conjunctivae and sclera clear. EOM intact. Pupils are equal, round, and reactive to light. Right eye exhibits no discharge. Left eye exhibits no discharge. Neck: Normal range of motion. Neck supple. No JVD present. No tracheal deviation present. No thyromegaly present.  Cardiovascular: Normal rate, regular rhythm, no gallop, no friction rub, no murmur heard.      Quiet precordium. 2+ radial and DP pulses . No  carotid bruits Pulmonary/Chest: Effort normal. No respiratory distress or increased WOB, no wheezes, no rales. No chest wall deformity or CVAT. Abdomen: Soft. Bowel sounds are normal in all quadrants. He exhibits no distension, no tenderness, no rebound or guarding, No heptosplenomegaly  Genitourinary:  deferred Musculoskeletal: Normal range of motion. He exhibits no edema and no tenderness.       Small and large joints without redness, synovial thickening or deformity. Full range of motion preserved about all small, median and large joints.  Lymphadenopathy:    He has no cervical or supraclavicular adenopathy.  Neurological: He is alert and oriented to person, place, and time. CN II-XII intact. DTRs 2+ and symmetrical biceps, radial and patellar tendons. Cerebellar function normal with no tremor, rigidity, normal gait and station.  Skin: Skin is warm and dry. No rash noted. No erythema. Plantar wart, with palpable callus, at the base of the 3rd toe right foot. Psychiatric: He has a normal mood and affect. His behavior is normal. Thought content normal.   Lab Results  Component Value Date   WBC 4.1*  09/22/2009   HGB 15.5 09/22/2009   HCT 44.3 09/22/2009   PLT 199.0 09/22/2009   GLUCOSE 92 04/13/2012   CHOL 164 04/13/2012   TRIG 74.0 04/13/2012   HDL 40.20 04/13/2012   LDLCALC 109* 04/13/2012        ALT 33 04/13/2012   AST 30 04/13/2012        NA 139 04/13/2012   K 4.2 04/13/2012   CL 104 04/13/2012   CREATININE 1.2 04/13/2012   BUN 14 04/13/2012   CO2 28 04/13/2012   TSH 0.80 03/06/2006   PSA 2.27 04/13/2012         Assessment & Plan:

## 2012-04-14 DIAGNOSIS — B070 Plantar wart: Secondary | ICD-10-CM | POA: Insufficient documentation

## 2012-04-14 DIAGNOSIS — N529 Male erectile dysfunction, unspecified: Secondary | ICD-10-CM | POA: Insufficient documentation

## 2012-04-14 NOTE — Assessment & Plan Note (Signed)
LDL is better than standard goal of 130 or less and close to goal for moderate to high risk patients of 100 or less. HDL is just at goal of 40 or higher. Liver functions are normal.  Plan  Continue present medication dose.

## 2012-04-14 NOTE — Assessment & Plan Note (Signed)
Reviewed sleep study - with AHI 5.4 he has mild obstructive sleep apnea with little medical risk. He finds it too uncomfortable to wear CPAP device. He denies any daytime somnolence or performance interference.  Plan If his symptoms should get worse would recommend ENT consult re: deviated septum.   If his symptoms should get worse he may be a candidate for a night time oral appliance.

## 2012-04-14 NOTE — Assessment & Plan Note (Addendum)
Interval history is negative for any major illness, injury or surgery. He does c/o foot pain from plantar wart and on-set of ED. Physical exam is normal. Lab results are in normal range. Immunizations are brought up to date. He will be due for colonoscopy at age 47. Discussed pros and cons of prostate cancer screening (USPHCTF recommendations reviewed and ACU April '13 recommendations) and he requests evaluation at this time due to positive family history. PSA is in normal range. He does have early signs of prostatism - slowed stream, nocturia, but not to the point of requiring any intervention.   In summary - a very nice man who works hard at staying fit who is medically stable. He will return in 1 year for routine follow up or sooner as needed.

## 2012-04-14 NOTE — Assessment & Plan Note (Signed)
Rare episodes of anxiety, perhaps related to having three teens, for which he may use alprazolam 0.5 mg as needed. He has had no difficulty with use, i.e. No somnolence or impairment of cognition.

## 2012-04-14 NOTE — Assessment & Plan Note (Signed)
BP Readings from Last 3 Encounters:  04/13/12 136/88  04/10/11 118/76  09/22/09 126/88   Good control on present medications

## 2012-04-14 NOTE — Assessment & Plan Note (Signed)
Jordan Travis reports incomplete tumescence and occasional inability to complete intercourse. No evidence of impotence. Life and responsibilities leave little room for "romance" since it is hard to get away for private time with his wife.  Plan- trial of Viagra

## 2012-04-14 NOTE — Assessment & Plan Note (Signed)
Small plantar wart which does cause discomfort with walking  Plan Manage small callus with pumice stone and use of pressure reducing "donut" pad  For unrelieved discomfort - cryotherapy

## 2012-10-26 ENCOUNTER — Other Ambulatory Visit: Payer: Self-pay | Admitting: Internal Medicine

## 2013-01-14 ENCOUNTER — Other Ambulatory Visit: Payer: Self-pay

## 2013-02-17 ENCOUNTER — Other Ambulatory Visit: Payer: Self-pay

## 2013-02-17 MED ORDER — OMEPRAZOLE 40 MG PO CPDR: 40.0000 mg | DELAYED_RELEASE_CAPSULE | Freq: Every day | ORAL | Status: DC

## 2013-02-23 ENCOUNTER — Other Ambulatory Visit: Payer: Self-pay | Admitting: Internal Medicine

## 2013-05-13 ENCOUNTER — Other Ambulatory Visit: Payer: Self-pay | Admitting: Internal Medicine

## 2013-06-08 ENCOUNTER — Telehealth: Payer: Self-pay

## 2013-06-08 NOTE — Telephone Encounter (Signed)
The patient is going out of town and is anticipating experiencing some motion sickness.  He was given the phone number of [facility] to choose a different pcp.  However, he is in need of a motion sickness medication before he leaves for his trip.  If there is an option, he was hoping for the medication in patch form.   [place] on [address] [phone]

## 2013-06-09 NOTE — Telephone Encounter (Signed)
Patient needs appointment to est new pcp

## 2013-06-09 NOTE — Telephone Encounter (Signed)
Pt scheduled for apt, thanks!

## 2013-06-09 NOTE — Telephone Encounter (Signed)
Transferred pt to scheduling for appoint.

## 2013-06-10 ENCOUNTER — Ambulatory Visit (INDEPENDENT_AMBULATORY_CARE_PROVIDER_SITE_OTHER): Payer: 59 | Admitting: Internal Medicine

## 2013-06-10 ENCOUNTER — Telehealth: Payer: Self-pay

## 2013-06-10 ENCOUNTER — Encounter: Payer: Self-pay | Admitting: Internal Medicine

## 2013-06-10 VITALS — BP 140/100 | HR 73 | Temp 98.1°F | Resp 14 | Wt 255.8 lb

## 2013-06-10 DIAGNOSIS — I10 Essential (primary) hypertension: Secondary | ICD-10-CM

## 2013-06-10 DIAGNOSIS — Z87898 Personal history of other specified conditions: Secondary | ICD-10-CM

## 2013-06-10 MED ORDER — MECLIZINE HCL 25 MG PO TABS: 25.0000 mg | ORAL_TABLET | Freq: Three times a day (TID) | ORAL | Status: AC | PRN

## 2013-06-10 MED ORDER — SCOPOLAMINE 1 MG/3DAYS TD PT72: 1.0000 | MEDICATED_PATCH | TRANSDERMAL | Status: AC

## 2013-06-10 NOTE — Patient Instructions (Signed)

## 2013-06-10 NOTE — Progress Notes (Signed)
Pre visit review using our clinic review tool, if applicable. No additional management support is needed unless otherwise documented below in the visit note. 

## 2013-06-10 NOTE — Telephone Encounter (Signed)
Jordan Travis from the [facility] (on [address]) called and needs to clarify the Transderm rx.  She states it only comes 1.5mg  dosage.    Pharmacy callback - [phone]

## 2013-06-10 NOTE — Progress Notes (Signed)
   Subjective:    Patient ID: Jordan Travis, male    DOB: 03-08-1966, 48 y.o.   MRN: [id number]  HPI  He is requesting motion patches for travel. Blood pressure range / average :120-130/85-90. Compliant with anti hypertemsive medication. No lightheadedness or other adverse medication effect described.  A low salt diet is followed. Exercise encompasses  90-120 minutes 2-4  times per week as  CVE without symptoms.        Review of Systems Significant headaches, epistaxis, chest pain, palpitations, exertional dyspnea, claudication, paroxysmal nocturnal dyspnea, or edema absent.      Objective:   Physical Exam Appears healthy and well-nourished & in no acute distress.  EOMI ; no nystagmus  No carotid bruits are present.No neck pain distention present at 10 - 15 degrees.   Heart rhythm and rate are normal with no gallop or murmur.S4 BP recheck 120/90.  Chest is clear with no increased work of breathing  There is no evidence of aortic aneurysm or renal artery bruits  Abdomen soft with no organomegaly or masses. No HJR  No clubbing, cyanosis or edema present.  Pedal pulses are intact   No ischemic skin changes are present . Fingernails/ toenails healthy   Alert and oriented. Strength, tone, DTRs reflexes normal          Assessment & Plan:  #32motion sickness #2 HTN  No change

## 2013-06-14 ENCOUNTER — Telehealth: Payer: Self-pay | Admitting: Internal Medicine

## 2013-06-14 NOTE — Telephone Encounter (Signed)
Relevant patient education assigned to patient using Emmi. ° °

## 2013-10-12 ENCOUNTER — Other Ambulatory Visit: Payer: Self-pay | Admitting: Internal Medicine

## 2013-10-12 ENCOUNTER — Other Ambulatory Visit: Payer: Self-pay

## 2013-10-12 DIAGNOSIS — Z0000 Encounter for general adult medical examination without abnormal findings: Secondary | ICD-10-CM

## 2013-10-12 NOTE — Telephone Encounter (Signed)
Former patient of Dr Linda Hedges  No upcoming appt Last seen 06/10/13 by Dr Linna Darner Last labs 04/13/12

## 2013-10-12 NOTE — Telephone Encounter (Signed)
OK X1  Recommend CPX ; orders entered  Last CPX 04/13/12

## 2013-10-13 MED ORDER — HYDROCHLOROTHIAZIDE 25 MG PO TABS: 25.0000 mg | ORAL_TABLET | Freq: Every day | ORAL | Status: DC

## 2013-12-21 ENCOUNTER — Other Ambulatory Visit (INDEPENDENT_AMBULATORY_CARE_PROVIDER_SITE_OTHER): Payer: 59

## 2013-12-21 ENCOUNTER — Telehealth: Payer: Self-pay | Admitting: Family

## 2013-12-21 ENCOUNTER — Ambulatory Visit (INDEPENDENT_AMBULATORY_CARE_PROVIDER_SITE_OTHER): Payer: 59 | Admitting: Family

## 2013-12-21 ENCOUNTER — Encounter: Payer: Self-pay | Admitting: Family

## 2013-12-21 VITALS — BP 130/92 | HR 69 | Temp 98.2°F | Resp 18 | Ht 71.5 in | Wt 255.4 lb

## 2013-12-21 DIAGNOSIS — I10 Essential (primary) hypertension: Secondary | ICD-10-CM

## 2013-12-21 DIAGNOSIS — E785 Hyperlipidemia, unspecified: Secondary | ICD-10-CM

## 2013-12-21 DIAGNOSIS — F419 Anxiety disorder, unspecified: Secondary | ICD-10-CM

## 2013-12-21 LAB — BASIC METABOLIC PANEL
BUN: 12 mg/dL (ref 6–23)
CHLORIDE: 104 meq/L (ref 96–112)
CO2: 22 mEq/L (ref 19–32)
Calcium: 9.6 mg/dL (ref 8.4–10.5)
Creatinine, Ser: 1.1 mg/dL (ref 0.4–1.5)
GFR: 77 mL/min (ref >60.00)
Glucose, Bld: 86 mg/dL (ref 70–99)
POTASSIUM: 3.9 meq/L (ref 3.5–5.1)
Sodium: 137 mEq/L (ref 135–145)

## 2013-12-21 LAB — LIPID PANEL
Cholesterol: 167 mg/dL (ref 0–200)
HDL: 36.4 mg/dL — ABNORMAL LOW (ref >39.00)
LDL CALC: 95 mg/dL (ref 0–99)
NonHDL: 130.6
Total CHOL/HDL Ratio: 5
Triglycerides: 176 mg/dL — ABNORMAL HIGH (ref 0.0–149.0)
VLDL: 35.2 mg/dL (ref 0.0–40.0)

## 2013-12-21 LAB — HEPATIC FUNCTION PANEL
ALT: 33 U/L (ref 0–53)
AST: 30 U/L (ref 0–37)
Albumin: 3.8 g/dL (ref 3.5–5.2)
Alkaline Phosphatase: 38 U/L — ABNORMAL LOW (ref 39–117)
BILIRUBIN DIRECT: 0.1 mg/dL (ref 0.0–0.3)
BILIRUBIN TOTAL: 0.7 mg/dL (ref 0.2–1.2)
Total Protein: 7.7 g/dL (ref 6.0–8.3)

## 2013-12-21 MED ORDER — HYDROCHLOROTHIAZIDE 25 MG PO TABS: 25.0000 mg | ORAL_TABLET | Freq: Every day | ORAL | Status: AC

## 2013-12-21 MED ORDER — LISINOPRIL 40 MG PO TABS: 40.0000 mg | ORAL_TABLET | Freq: Every day | ORAL | Status: DC

## 2013-12-21 MED ORDER — OMEPRAZOLE 40 MG PO CPDR: 40.0000 mg | DELAYED_RELEASE_CAPSULE | Freq: Every day | ORAL | Status: AC

## 2013-12-21 NOTE — Assessment & Plan Note (Signed)
BP remains above goal of 140/90. Increase lisinopril to 20 mg daily. Rx to refill medication given. Will check

## 2013-12-21 NOTE — Telephone Encounter (Signed)
Please call the patient and tell him that his lab work is within the normal range. His triglycerides are a little high, but that was expected per our conversation. Increasing fruits, vegetables and fiber should help to bring the number down below the 150 goal (his was 176).

## 2013-12-21 NOTE — Patient Instructions (Signed)
Thank you for choosing [facility].  Summary/Instructions:   Increase lisinopril to 20 mg daily  Keep taking the HCTZ 12.5 mg  Please stop by the lab to get your blood work done.

## 2013-12-21 NOTE — Assessment & Plan Note (Signed)
Currently stable. Reports anxiety is under control.

## 2013-12-21 NOTE — Assessment & Plan Note (Signed)
Currently stable on Mevacor. Obtain lipid panel. Continue current medication.

## 2013-12-21 NOTE — Progress Notes (Signed)
   Subjective:    Patient ID: [name], male    DOB: 02-28-1966, 48 y.o.   MRN: [id number]  HPI:  Jordan Travis is a 48 y.o. male who presents today to establish care and for an office visit.    1) Hypertension - Currently HCTZ 12.5 mg and 10 mg of the lisinopril. Indicates a mild cough, but not bothersome. Denies chest pain/discomfort, shortness of breath, or edema, no heart palpitations. Last eye exam up to date.    BP Readings from Last 3 Encounters:  12/21/13 130/92  06/10/13 140/100  04/13/12 136/88   2) GERD - Stable with 40 mg omeprazole. Indicates that he notices when he doesn't take the medication.   3) Hyperlipidemia - currently maintained on Mevachor. Denies any abnormal muscle pain or side effects.   Lipid Panel     Component Value Date/Time   CHOL 164 04/13/2012 1047   TRIG 74.0 04/13/2012 1047   HDL 40.20 04/13/2012 1047   CHOLHDL 4 04/13/2012 1047   VLDL 14.8 04/13/2012 1047   LDLCALC 109* 04/13/2012 1047   4) Anxiety - currently stable. Requires the xanax infrequently. Believes the anxiety to be under control.    Review of Systems  See HPI    Objective:     BP 130/92  Pulse 69  Temp(Src) 98.2 F (36.8 C) (Oral)  Resp 18  Ht 5' 11.5" (1.816 m)  Wt 255 lb 6.4 oz (115.849 kg)  BMI 35.13 kg/m2  SpO2 96% Nursing note and vital signs reviewed.  Physical Exam  Constitutional: He is oriented to person, place, and time. He appears well-developed and well-nourished. No distress.  Cardiovascular: Normal rate, regular rhythm, normal heart sounds and intact distal pulses.   Pulmonary/Chest: Effort normal and breath sounds normal.  Neurological: He is alert and oriented to person, place, and time.  Skin: Skin is warm and dry.  Psychiatric: He has a normal mood and affect. His behavior is normal. Judgment and thought content normal.        Assessment & Plan:

## 2013-12-21 NOTE — Progress Notes (Signed)
Pre visit review using our clinic review tool, if applicable. No additional management support is needed unless otherwise documented below in the visit note. 

## 2013-12-22 NOTE — Telephone Encounter (Signed)
Called pt to let him know labs were normal and to increase fruit, vegetables and fiber intake to bring the level down.

## 2013-12-27 ENCOUNTER — Telehealth: Payer: Self-pay | Admitting: Family

## 2013-12-27 MED ORDER — LISINOPRIL 40 MG PO TABS: 40.0000 mg | ORAL_TABLET | Freq: Every day | ORAL | Status: AC

## 2013-12-27 NOTE — Telephone Encounter (Signed)
Pt was seen last week and [facility] did not receive the script for Lisinopril.  Pt uses the [facility] on [address].

## 2013-12-27 NOTE — Telephone Encounter (Signed)
Called pt to let them know rx for lisinopril was resent to pharmacy.

## 2013-12-27 NOTE — Telephone Encounter (Signed)
Rx resent.

## 2014-07-14 ENCOUNTER — Other Ambulatory Visit (HOSPITAL_COMMUNITY): Payer: Self-pay | Admitting: Physician Assistant

## 2014-09-19 ENCOUNTER — Telehealth: Payer: Self-pay

## 2014-09-19 NOTE — Telephone Encounter (Signed)
Was sent an rx request for lovastatin. Pt has not been seen or had cholesterol blood work done since 12/21/13. Please advise

## 2014-09-19 NOTE — Telephone Encounter (Signed)
Patient needs office visit please.

## 2014-09-20 NOTE — Telephone Encounter (Signed)
Seeing a new PCP

## 2015-01-26 ENCOUNTER — Other Ambulatory Visit: Payer: Self-pay | Admitting: Gastroenterology

## 2015-07-18 ENCOUNTER — Other Ambulatory Visit: Payer: Self-pay | Admitting: Physician Assistant

## 2016-06-21 ENCOUNTER — Other Ambulatory Visit (HOSPITAL_BASED_OUTPATIENT_CLINIC_OR_DEPARTMENT_OTHER): Payer: Self-pay

## 2016-06-21 DIAGNOSIS — G4733 Obstructive sleep apnea (adult) (pediatric): Secondary | ICD-10-CM

## 2016-07-29 ENCOUNTER — Encounter (HOSPITAL_BASED_OUTPATIENT_CLINIC_OR_DEPARTMENT_OTHER): Payer: Self-pay
